# Patient Record
Sex: Female | Born: 2000 | Race: White | Hispanic: No | Marital: Single | Smoking: Never smoker
Health system: Southern US, Community
[De-identification: ages and names within clinical notes are randomized; demographics above are authoritative.]

## PROBLEM LIST (undated history)

## (undated) DIAGNOSIS — E2839 Other primary ovarian failure: Secondary | ICD-10-CM

## (undated) DIAGNOSIS — E079 Disorder of thyroid, unspecified: Secondary | ICD-10-CM

## (undated) DIAGNOSIS — E039 Hypothyroidism, unspecified: Secondary | ICD-10-CM

## (undated) DIAGNOSIS — N179 Acute kidney failure, unspecified: Secondary | ICD-10-CM

## (undated) DIAGNOSIS — E119 Type 2 diabetes mellitus without complications: Secondary | ICD-10-CM

## (undated) HISTORY — PX: NO PAST SURGERIES: SHX2092

---
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
## 2018-02-08 ENCOUNTER — Encounter (HOSPITAL_COMMUNITY): Payer: Self-pay

## 2018-02-08 ENCOUNTER — Other Ambulatory Visit: Payer: Self-pay

## 2018-02-08 ENCOUNTER — Emergency Department (HOSPITAL_COMMUNITY): Payer: Medicaid Other

## 2018-02-08 ENCOUNTER — Inpatient Hospital Stay (HOSPITAL_COMMUNITY)
Admission: EM | Admit: 2018-02-08 | Discharge: 2018-02-10 | DRG: 638 | Disposition: A | Discharge status: Home / Self Care | Payer: Medicaid Other | Attending: Internal Medicine | Admitting: Internal Medicine

## 2018-02-08 DIAGNOSIS — N179 Acute kidney failure, unspecified: Secondary | ICD-10-CM | POA: Diagnosis present

## 2018-02-08 DIAGNOSIS — H3410 Central retinal artery occlusion, unspecified eye: Secondary | ICD-10-CM | POA: Diagnosis present

## 2018-02-08 DIAGNOSIS — A084 Viral intestinal infection, unspecified: Secondary | ICD-10-CM | POA: Diagnosis present

## 2018-02-08 DIAGNOSIS — E2839 Other primary ovarian failure: Secondary | ICD-10-CM | POA: Diagnosis present

## 2018-02-08 DIAGNOSIS — E860 Dehydration: Secondary | ICD-10-CM | POA: Diagnosis present

## 2018-02-08 DIAGNOSIS — Z793 Long term (current) use of hormonal contraceptives: Secondary | ICD-10-CM

## 2018-02-08 DIAGNOSIS — E1010 Type 1 diabetes mellitus with ketoacidosis without coma: Principal | ICD-10-CM | POA: Diagnosis present

## 2018-02-08 DIAGNOSIS — R1110 Vomiting, unspecified: Secondary | ICD-10-CM | POA: Diagnosis present

## 2018-02-08 DIAGNOSIS — Z79899 Other long term (current) drug therapy: Secondary | ICD-10-CM

## 2018-02-08 DIAGNOSIS — E039 Hypothyroidism, unspecified: Secondary | ICD-10-CM | POA: Diagnosis present

## 2018-02-08 HISTORY — DX: Acute kidney failure, unspecified: N17.9

## 2018-02-08 LAB — BASIC METABOLIC PANEL
Anion gap: 20 — ABNORMAL HIGH (ref 5–15)
BUN: 13 mg/dL (ref 6–20)
CHLORIDE: 100 mmol/L — AB (ref 101–111)
CO2: 13 mmol/L — ABNORMAL LOW (ref 22–32)
CREATININE: 1.22 mg/dL — AB (ref 0.44–1.00)
Calcium: 9.9 mg/dL (ref 8.9–10.3)
GFR calc Af Amer: >60 mL/min (ref >60)
GFR calc non Af Amer: >60 mL/min (ref >60)
GLUCOSE: 171 mg/dL — AB (ref 65–99)
POTASSIUM: 4.1 mmol/L (ref 3.5–5.1)
SODIUM: 133 mmol/L — AB (ref 135–145)

## 2018-02-08 LAB — CBC
HEMATOCRIT: 47.9 % — AB (ref 36.0–46.0)
Hemoglobin: 16.9 g/dL — ABNORMAL HIGH (ref 12.0–15.0)
MCH: 32.5 pg (ref 26.0–34.0)
MCHC: 35.3 g/dL (ref 30.0–36.0)
MCV: 92.1 fL (ref 78.0–100.0)
PLATELETS: 497 10*3/uL — AB (ref 150–400)
RBC: 5.2 MIL/uL — ABNORMAL HIGH (ref 3.87–5.11)
RDW: 12.2 % (ref 11.5–15.5)
WBC: 12.9 10*3/uL — ABNORMAL HIGH (ref 4.0–10.5)

## 2018-02-08 LAB — I-STAT TROPONIN, ED: Troponin i, poc: 0 ng/mL (ref 0.00–0.08)

## 2018-02-08 LAB — URINALYSIS, ROUTINE W REFLEX MICROSCOPIC
Bacteria, UA: NONE SEEN
Bilirubin Urine: NEGATIVE
Glucose, UA: >=500 mg/dL — AB
Ketones, ur: 80 mg/dL — AB
Leukocytes, UA: NEGATIVE
Nitrite: NEGATIVE
PROTEIN: 100 mg/dL — AB
SPECIFIC GRAVITY, URINE: 1.023 (ref 1.005–1.030)
pH: 5 (ref 5.0–8.0)

## 2018-02-08 LAB — I-STAT BETA HCG BLOOD, ED (MC, WL, AP ONLY): I-stat hCG, quantitative: <5 m[IU]/mL (ref <5)

## 2018-02-08 NOTE — ED Triage Notes (Signed)
Pt states that she is type one diabetic and has been vomiting since yesterday and her CBG has been high, reports CP with palpations, radiation to back and SOB when taking a deep breath.

## 2018-02-09 ENCOUNTER — Encounter (HOSPITAL_COMMUNITY): Payer: Self-pay | Admitting: Physician Assistant

## 2018-02-09 DIAGNOSIS — E039 Hypothyroidism, unspecified: Secondary | ICD-10-CM

## 2018-02-09 DIAGNOSIS — Z79899 Other long term (current) drug therapy: Secondary | ICD-10-CM | POA: Diagnosis not present

## 2018-02-09 DIAGNOSIS — R197 Diarrhea, unspecified: Secondary | ICD-10-CM

## 2018-02-09 DIAGNOSIS — E860 Dehydration: Secondary | ICD-10-CM

## 2018-02-09 DIAGNOSIS — H3410 Central retinal artery occlusion, unspecified eye: Secondary | ICD-10-CM | POA: Diagnosis present

## 2018-02-09 DIAGNOSIS — N179 Acute kidney failure, unspecified: Secondary | ICD-10-CM | POA: Diagnosis present

## 2018-02-09 DIAGNOSIS — E1010 Type 1 diabetes mellitus with ketoacidosis without coma: Principal | ICD-10-CM

## 2018-02-09 DIAGNOSIS — E2839 Other primary ovarian failure: Secondary | ICD-10-CM | POA: Diagnosis present

## 2018-02-09 DIAGNOSIS — Z793 Long term (current) use of hormonal contraceptives: Secondary | ICD-10-CM | POA: Diagnosis not present

## 2018-02-09 DIAGNOSIS — A084 Viral intestinal infection, unspecified: Secondary | ICD-10-CM | POA: Diagnosis present

## 2018-02-09 DIAGNOSIS — R1110 Vomiting, unspecified: Secondary | ICD-10-CM

## 2018-02-09 LAB — HEPATIC FUNCTION PANEL
ALBUMIN: 4.2 g/dL (ref 3.5–5.0)
ALT: 13 U/L — ABNORMAL LOW (ref 14–54)
AST: 16 U/L (ref 15–41)
Alkaline Phosphatase: 63 U/L (ref 38–126)
Bilirubin, Direct: 0.2 mg/dL (ref 0.1–0.5)
Indirect Bilirubin: 1.8 mg/dL — ABNORMAL HIGH (ref 0.3–0.9)
Total Bilirubin: 2 mg/dL — ABNORMAL HIGH (ref 0.3–1.2)
Total Protein: 7.5 g/dL (ref 6.5–8.1)

## 2018-02-09 LAB — BASIC METABOLIC PANEL
ANION GAP: 6 (ref 5–15)
ANION GAP: 7 (ref 5–15)
ANION GAP: 14 (ref 5–15)
Anion gap: 20 — ABNORMAL HIGH (ref 5–15)
BUN: 12 mg/dL (ref 6–20)
BUN: 6 mg/dL (ref 6–20)
BUN: <5 mg/dL — ABNORMAL LOW (ref 6–20)
BUN: <5 mg/dL — ABNORMAL LOW (ref 6–20)
CALCIUM: 8.8 mg/dL — AB (ref 8.9–10.3)
CALCIUM: 8.8 mg/dL — AB (ref 8.9–10.3)
CHLORIDE: 111 mmol/L (ref 101–111)
CHLORIDE: 112 mmol/L — AB (ref 101–111)
CHLORIDE: 106 mmol/L (ref 101–111)
CO2: 11 mmol/L — ABNORMAL LOW (ref 22–32)
CO2: 19 mmol/L — AB (ref 22–32)
CO2: 19 mmol/L — AB (ref 22–32)
CO2: 15 mmol/L — AB (ref 22–32)
CREATININE: 0.73 mg/dL (ref 0.44–1.00)
Calcium: 7.9 mg/dL — ABNORMAL LOW (ref 8.9–10.3)
Calcium: 8.5 mg/dL — ABNORMAL LOW (ref 8.9–10.3)
Chloride: 104 mmol/L (ref 101–111)
Creatinine, Ser: 1.03 mg/dL — ABNORMAL HIGH (ref 0.44–1.00)
Creatinine, Ser: 0.64 mg/dL (ref 0.44–1.00)
Creatinine, Ser: 0.8 mg/dL (ref 0.44–1.00)
GFR calc Af Amer: >60 mL/min (ref >60)
GFR calc Af Amer: >60 mL/min (ref >60)
GFR calc non Af Amer: >60 mL/min (ref >60)
GFR calc non Af Amer: >60 mL/min (ref >60)
GFR calc non Af Amer: >60 mL/min (ref >60)
GFR calc non Af Amer: >60 mL/min (ref >60)
GLUCOSE: 198 mg/dL — AB (ref 65–99)
GLUCOSE: 59 mg/dL — AB (ref 65–99)
GLUCOSE: 318 mg/dL — AB (ref 65–99)
Glucose, Bld: 184 mg/dL — ABNORMAL HIGH (ref 65–99)
Potassium: 3.5 mmol/L (ref 3.5–5.1)
Potassium: 3.2 mmol/L — ABNORMAL LOW (ref 3.5–5.1)
Potassium: 3.2 mmol/L — ABNORMAL LOW (ref 3.5–5.1)
Potassium: 3.8 mmol/L (ref 3.5–5.1)
SODIUM: 135 mmol/L (ref 135–145)
Sodium: 136 mmol/L (ref 135–145)
Sodium: 138 mmol/L (ref 135–145)
Sodium: 135 mmol/L (ref 135–145)

## 2018-02-09 LAB — CBG MONITORING, ED
GLUCOSE-CAPILLARY: 146 mg/dL — AB (ref 65–99)
GLUCOSE-CAPILLARY: 196 mg/dL — AB (ref 65–99)
GLUCOSE-CAPILLARY: 156 mg/dL — AB (ref 65–99)
GLUCOSE-CAPILLARY: 179 mg/dL — AB (ref 65–99)
Glucose-Capillary: 104 mg/dL — ABNORMAL HIGH (ref 65–99)
Glucose-Capillary: 185 mg/dL — ABNORMAL HIGH (ref 65–99)
Glucose-Capillary: 166 mg/dL — ABNORMAL HIGH (ref 65–99)
Glucose-Capillary: 133 mg/dL — ABNORMAL HIGH (ref 65–99)
Glucose-Capillary: 195 mg/dL — ABNORMAL HIGH (ref 65–99)
Glucose-Capillary: 193 mg/dL — ABNORMAL HIGH (ref 65–99)
Glucose-Capillary: 170 mg/dL — ABNORMAL HIGH (ref 65–99)

## 2018-02-09 LAB — GLUCOSE, CAPILLARY
GLUCOSE-CAPILLARY: 251 mg/dL — AB (ref 65–99)
Glucose-Capillary: 59 mg/dL — ABNORMAL LOW (ref 65–99)
Glucose-Capillary: 78 mg/dL (ref 65–99)
Glucose-Capillary: 76 mg/dL (ref 65–99)
Glucose-Capillary: 383 mg/dL — ABNORMAL HIGH (ref 65–99)

## 2018-02-09 LAB — LIPASE, BLOOD: Lipase: 20 U/L (ref 11–51)

## 2018-02-09 MED ORDER — NORETHINDRONE ACET-ETHINYL EST 1-20 MG-MCG PO TABS: 1.0000 | ORAL_TABLET | Freq: Every day | ORAL | Status: DC

## 2018-02-09 MED ORDER — ONDANSETRON HCL 4 MG/2ML IJ SOLN
4.0000 mg | Freq: Once | INTRAMUSCULAR | Status: AC
  Administered 2018-02-09: 4 mg via INTRAVENOUS
  Filled 2018-02-09: qty 2

## 2018-02-09 MED ORDER — POTASSIUM CHLORIDE 10 MEQ/100ML IV SOLN
10.0000 meq | INTRAVENOUS | Status: AC
  Administered 2018-02-09: 10 meq via INTRAVENOUS
  Filled 2018-02-09: qty 100

## 2018-02-09 MED ORDER — INSULIN GLARGINE 100 UNIT/ML ~~LOC~~ SOLN
21.0000 [IU] | Freq: Every day | SUBCUTANEOUS | Status: DC
  Administered 2018-02-09: 21 [IU] via SUBCUTANEOUS
  Filled 2018-02-09 (×2): qty 0.21

## 2018-02-09 MED ORDER — ENOXAPARIN SODIUM 40 MG/0.4ML ~~LOC~~ SOLN
40.0000 mg | Freq: Every day | SUBCUTANEOUS | Status: DC
  Filled 2018-02-09 (×2): qty 0.4

## 2018-02-09 MED ORDER — SODIUM CHLORIDE 0.9 % IV BOLUS
1000.0000 mL | Freq: Once | INTRAVENOUS | Status: AC
  Administered 2018-02-09: 1000 mL via INTRAVENOUS

## 2018-02-09 MED ORDER — ACETAMINOPHEN 325 MG PO TABS: 650.0000 mg | ORAL_TABLET | Freq: Four times a day (QID) | ORAL | Status: DC | PRN

## 2018-02-09 MED ORDER — SODIUM CHLORIDE 0.9 % IV SOLN: INTRAVENOUS | Status: DC

## 2018-02-09 MED ORDER — SODIUM CHLORIDE 0.9 % IV SOLN
INTRAVENOUS | Status: DC
  Administered 2018-02-09: 500 mL via INTRAVENOUS

## 2018-02-09 MED ORDER — POTASSIUM CHLORIDE CRYS ER 20 MEQ PO TBCR
40.0000 meq | EXTENDED_RELEASE_TABLET | ORAL | Status: AC
  Administered 2018-02-09 (×2): 40 meq via ORAL
  Filled 2018-02-09 (×2): qty 2

## 2018-02-09 MED ORDER — DEXTROSE 10 % IV SOLN
INTRAVENOUS | Status: DC
  Administered 2018-02-09: 06:00:00 via INTRAVENOUS

## 2018-02-09 MED ORDER — POTASSIUM CHLORIDE 10 MEQ/100ML IV SOLN
10.0000 meq | INTRAVENOUS | Status: DC
  Filled 2018-02-09 (×2): qty 100

## 2018-02-09 MED ORDER — DEXTROSE-NACL 5-0.45 % IV SOLN: INTRAVENOUS | Status: DC

## 2018-02-09 MED ORDER — SODIUM CHLORIDE 0.9 % IV SOLN
INTRAVENOUS | Status: DC
  Administered 2018-02-09: 23:00:00 via INTRAVENOUS

## 2018-02-09 MED ORDER — DEXTROSE 10 % IV SOLN
INTRAVENOUS | Status: DC
  Administered 2018-02-09: 1000 mL via INTRAVENOUS
  Administered 2018-02-09: 17:00:00 via INTRAVENOUS

## 2018-02-09 MED ORDER — LEVOTHYROXINE SODIUM 88 MCG PO TABS
88.0000 ug | ORAL_TABLET | Freq: Every day | ORAL | Status: DC
  Administered 2018-02-09: 88 ug via ORAL
  Filled 2018-02-09 (×2): qty 1

## 2018-02-09 MED ORDER — HYDROCODONE-ACETAMINOPHEN 5-325 MG PO TABS: 1.0000 | ORAL_TABLET | ORAL | Status: DC | PRN

## 2018-02-09 MED ORDER — BISACODYL 10 MG RE SUPP: 10.0000 mg | Freq: Every day | RECTAL | Status: DC | PRN

## 2018-02-09 MED ORDER — SODIUM CHLORIDE 0.9 % IV SOLN
INTRAVENOUS | Status: DC
  Administered 2018-02-09: 0.5 [IU]/h via INTRAVENOUS
  Administered 2018-02-09: 1.1 [IU]/h via INTRAVENOUS
  Administered 2018-02-09: 2.7 [IU]/h via INTRAVENOUS

## 2018-02-09 MED ORDER — INSULIN GLARGINE 100 UNITS/ML SOLOSTAR PEN
21.0000 [IU] | PEN_INJECTOR | Freq: Every day | SUBCUTANEOUS | Status: DC
  Filled 2018-02-09: qty 3

## 2018-02-09 MED ORDER — SODIUM CHLORIDE 0.9 % IV SOLN
INTRAVENOUS | Status: DC
  Administered 2018-02-09: 1.3 [IU]/h via INTRAVENOUS
  Filled 2018-02-09: qty 1

## 2018-02-09 MED ORDER — POTASSIUM CHLORIDE 10 MEQ/100ML IV SOLN
10.0000 meq | Freq: Once | INTRAVENOUS | Status: AC
  Administered 2018-02-09: 10 meq via INTRAVENOUS
  Filled 2018-02-09: qty 100

## 2018-02-09 MED ORDER — INSULIN ASPART 100 UNIT/ML ~~LOC~~ SOLN
5.0000 [IU] | Freq: Once | SUBCUTANEOUS | Status: AC
  Administered 2018-02-09: 5 [IU] via SUBCUTANEOUS

## 2018-02-09 MED ORDER — ACETAMINOPHEN 650 MG RE SUPP: 650.0000 mg | Freq: Four times a day (QID) | RECTAL | Status: DC | PRN

## 2018-02-09 MED ORDER — POTASSIUM CHLORIDE 10 MEQ/100ML IV SOLN: 10.0000 meq | INTRAVENOUS | Status: DC

## 2018-02-09 MED ORDER — SENNOSIDES-DOCUSATE SODIUM 8.6-50 MG PO TABS: 1.0000 | ORAL_TABLET | Freq: Every evening | ORAL | Status: DC | PRN

## 2018-02-09 MED ORDER — DEXTROSE 50 % IV SOLN
INTRAVENOUS | Status: AC
  Administered 2018-02-09: 16 mL
  Filled 2018-02-09: qty 50

## 2018-02-09 MED ORDER — ONDANSETRON HCL 4 MG PO TABS: 4.0000 mg | ORAL_TABLET | Freq: Four times a day (QID) | ORAL | Status: DC | PRN

## 2018-02-09 MED ORDER — INSULIN ASPART 100 UNIT/ML ~~LOC~~ SOLN
0.0000 [IU] | Freq: Three times a day (TID) | SUBCUTANEOUS | Status: DC
  Administered 2018-02-10: 2 [IU] via SUBCUTANEOUS

## 2018-02-09 MED ORDER — ONDANSETRON HCL 4 MG/2ML IJ SOLN: 4.0000 mg | Freq: Four times a day (QID) | INTRAMUSCULAR | Status: DC | PRN

## 2018-02-09 MED ORDER — ENOXAPARIN SODIUM 40 MG/0.4ML ~~LOC~~ SOLN
40.0000 mg | Freq: Every day | SUBCUTANEOUS | Status: DC
  Filled 2018-02-09: qty 0.4

## 2018-02-09 NOTE — ED Notes (Signed)
Per MD set insulin at 0.5 and give clear liquid with sugar. Sprite given.

## 2018-02-09 NOTE — Progress Notes (Signed)
Nancy Gallagher is a 17 y.o. female patient admitted from ED awake, alert - oriented  X 4 - no acute distress noted.  VSS - Blood pressure 93/62, pulse 69, temperature 98.4 F (36.9 C), temperature source Oral, resp. rate 18, SpO2 100 %.    IV in place, occlusive dsg intact without redness.  Orientation to room, and floor completed with information packet given to patient/family.  Patient declined safety video at this time.  Admission INP armband ID verified with patient/family, and in place.   SR up x 2, fall assessment complete, with patient and family able to verbalize understanding of risk associated with falls, and verbalized understanding to call nsg before up out of bed.  Call light within reach, patient able to voice, and demonstrate understanding.  Skin, clean-dry- intact without evidence of bruising, or skin tears.   No evidence of skin break down noted on exam.     Will cont to eval and treat per MD orders.  Caren Hazy, RN 02/09/2018 6:23 PM

## 2018-02-09 NOTE — ED Provider Notes (Signed)
[facility] EMERGENCY DEPARTMENT Provider Note   CSN: [id number] Arrival date & time: 02/08/18  2215     History   Chief Complaint Chief Complaint  Patient presents with  . Emesis  . Chest Pain    HPI Nancy Gallagher is a 17 y.o. female.  HPI Patient is a 17 year old female with a history of type 1 diabetes who presents the emergency department for 2 days of nausea vomiting without diarrhea.  No fevers or chills.  She reports some chest pain and some shortness of breath when taking a breath.  She states the pain in her chest is related more to her vomiting.  She has a history of DKA reports her breathing felt this way with DKA before in the past.  They checked ketone strips at home and they were extremely positive.  Her highest blood sugar was 350 has home.    Past Medical History:  Diagnosis Date  . Diabetes mellitus without complication (HCC)   . Hypothyroid   . Ovarian failure    bilateral  . Thyroid disease     Patient Active Problem List   Diagnosis Date Noted  . Diabetic ketoacidosis without coma associated with type 1 diabetes mellitus (HCC)   . Dehydration, moderate 08/24/2014  . Emesis 08/24/2014  . Metabolic acidosis, increased anion gap 08/24/2014  . DKA (diabetic ketoacidoses) (HCC) 08/24/2014    History reviewed. No pertinent surgical history.   OB History   None      Home Medications    Prior to Admission medications   Medication Sig Start Date End Date Taking? Authorizing Provider  HYDROcodone-acetaminophen (NORCO) 5-325 MG tablet Take 1 tablet by mouth every 6 (six) hours as needed for severe pain. 10/08/17  Yes Loren Racer, MD  insulin aspart (NOVOLOG FLEXPEN) 100 UNIT/ML FlexPen Inject 2-10 Units into the skin 3 (three) times daily with meals. Inject 1 unit subcutaneously for every 7 grams of carbs consumed   Yes [provider]  insulin glargine (LANTUS) 100 unit/mL SOPN Inject 0.25 mLs (25 Units total) into the  skin daily at 10 pm. Patient taking differently: Inject 21 Units into the skin at bedtime.  08/26/14  Yes Pincus Large, DO  levothyroxine (SYNTHROID, LEVOTHROID) 88 MCG tablet Take 88 mcg by mouth at bedtime.    Yes [provider]  norethindrone-ethinyl estradiol (JUNEL 1/20) 1-20 MG-MCG tablet Take 1 tablet by mouth at bedtime.   Yes [provider]  ondansetron (ZOFRAN ODT) 4 MG disintegrating tablet Take 1 tablet (4 mg total) by mouth every 8 (eight) hours as needed for nausea or vomiting. 08/21/16  Yes Ree Shay, MD    Family History No family history on file.  Social History Social History   Tobacco Use  . Smoking status: Never Smoker  . Smokeless tobacco: Never Used  Substance Use Topics  . Alcohol use: Yes    Comment: Occasionally   . Drug use: No     Allergies   Patient has no known allergies.   Review of Systems Review of Systems  All other systems reviewed and are negative.    Physical Exam Updated Vital Signs BP 116/85   Pulse 81   Temp 99 F (37.2 C) (Oral)   Resp 14   SpO2 100%   Physical Exam  Constitutional: She is oriented to person, place, and time. She appears well-developed and well-nourished. No distress.  HENT:  Head: Normocephalic and atraumatic.  Eyes: EOM are normal.  Neck:  Normal range of motion.  Cardiovascular: Normal rate, regular rhythm and normal heart sounds.  Pulmonary/Chest: Effort normal and breath sounds normal.  Abdominal: Soft. She exhibits no distension. There is no tenderness.  Musculoskeletal: Normal range of motion.  Neurological: She is alert and oriented to person, place, and time.  Skin: Skin is warm and dry.  Psychiatric: She has a normal mood and affect. Judgment normal.  Nursing note and vitals reviewed.    ED Treatments / Results  Labs (all labs ordered are listed, but only abnormal results are displayed) Labs Reviewed  BASIC METABOLIC PANEL - Abnormal; Notable for the following  components:      Result Value   Sodium 133 (*)    Chloride 100 (*)    CO2 13 (*)    Glucose, Bld 171 (*)    Creatinine, Ser 1.22 (*)    Anion gap 20 (*)    All other components within normal limits  CBC - Abnormal; Notable for the following components:   WBC 12.9 (*)    RBC 5.20 (*)    Hemoglobin 16.9 (*)    HCT 47.9 (*)    Platelets 497 (*)    All other components within normal limits  URINALYSIS, ROUTINE W REFLEX MICROSCOPIC - Abnormal; Notable for the following components:   Glucose, UA >=500 (*)    Hgb urine dipstick SMALL (*)    Ketones, ur 80 (*)    Protein, ur 100 (*)    All other components within normal limits  CBG MONITORING, ED - Abnormal; Notable for the following components:   Glucose-Capillary 104 (*)    All other components within normal limits  CBG MONITORING, ED - Abnormal; Notable for the following components:   Glucose-Capillary 146 (*)    All other components within normal limits  HEPATIC FUNCTION PANEL  LIPASE, BLOOD  I-STAT TROPONIN, ED  I-STAT BETA HCG BLOOD, ED (MC, [facility], AP ONLY)    EKG EKG Interpretation  Date/Time:  Thursday Feb 08 2018 23:01:22 EDT Ventricular Rate:  116 PR Interval:  114 QRS Duration: 68 QT Interval:  282 QTC Calculation: 391 R Axis:   88 Text Interpretation:  Sinus tachycardia Otherwise normal ECG No old tracing to compare Confirmed by Dione Booze ([id number]) on 02/09/2018 1:43:40 AM   Radiology Dg Chest 2 View  Result Date: 02/08/2018 CLINICAL DATA:  Vomiting since yesterday. Chest pain with palpitations. EXAM: CHEST - 2 VIEW COMPARISON:  08/21/2016 FINDINGS: The heart size and mediastinal contours are within normal limits. Both lungs are clear. The visualized skeletal structures are unremarkable. IMPRESSION: No active cardiopulmonary disease. Electronically Signed   By: Tollie Eth M.D.   On: 02/08/2018 23:26    Procedures .Critical Care Performed by: Azalia Bilis, MD Authorized by: Azalia Bilis, MD    CRITICAL  CARE Performed by: Azalia Bilis Total critical care time: 31 minutes Critical care time was exclusive of separately billable procedures and treating other patients. Critical care was necessary to treat or prevent imminent or life-threatening deterioration. Critical care was time spent personally by me on the following activities: development of treatment plan with patient and/or surrogate as well as nursing, discussions with consultants, evaluation of patient's response to treatment, examination of patient, obtaining history from patient or surrogate, ordering and performing treatments and interventions, ordering and review of laboratory studies, ordering and review of radiographic studies, pulse oximetry and re-evaluation of patient's condition.   Medications Ordered in ED Medications  insulin regular (NOVOLIN R,HUMULIN R) 100 Units in  sodium chloride 0.9 % 100 mL (1 Units/mL) infusion (has no administration in time range)  sodium chloride 0.9 % bolus 1,000 mL (has no administration in time range)    And  sodium chloride 0.9 % bolus 1,000 mL (has no administration in time range)    And  0.9 %  sodium chloride infusion (has no administration in time range)  dextrose 10 % infusion (has no administration in time range)  ondansetron (ZOFRAN) injection 4 mg (has no administration in time range)     Initial Impression / Assessment and Plan / ED Course  I have reviewed the triage vital signs and the nursing notes.  Pertinent labs & imaging results that were available during my care of the patient were reviewed by me and considered in my medical decision making (see chart for details).     Patient presenting with nausea vomiting hyperglycemia.  Anion gap is 20.  Bicarb is 13.  Patient with evidence of diabetic ketoacidosis.  LFTs and lipase will be added.  No significant tenderness in her abdomen at this time.  Urine shows no signs of infection.  Patient will be started on insulin drip at this  time.  IV fluid bolus for fluid resuscitation.  Tachycardic on arrival to the ER.  Likely orthostatic.  Chest x-ray without abnormality.  Patient will be monitored closely in the emergency department.  She is stable for admission to the stepdown unit at this time.  Patient and family updated.  Final Clinical Impressions(s) / ED Diagnoses   Final diagnoses:  Type 1 diabetes mellitus with ketoacidosis without coma [facility])    ED Discharge Orders    None       Azalia Bilis, MD 02/09/18 [phone]

## 2018-02-09 NOTE — ED Notes (Signed)
Labs and imagining reviewed

## 2018-02-09 NOTE — ED Notes (Signed)
Patient transferred to hospital bed.  

## 2018-02-09 NOTE — H&P (Addendum)
History and Physical    BERDENE ASKARI ZOX:[id number] DOB: [birth year] DOA: 02/08/2018   PCP: Irena Reichmann, DO   Patient coming from:  Home    Chief Complaint: Nausea and vomiting  HPI: DAZHA KEMPA is a 17 y.o. female with medical history significant for hypothyroidism, ANA positive, diabetes type 1 formally diagnosed in 2005,  on Lantus and Novologwith goal A1c less than 7.5%.,  History of DKA at age 41, 43, 67 and now, presenting with 1 day history of nausea and vomiting, without diarrhea. These symptoms followed a recent viral gastroenteritis 1 week ago. She denies any fever or chills. She reported some chest pain and shortness of breath but she reports that, compared to her prior presentations with DKA these symptoms are similar.  She denies any muscle cramps, weight gain, mood swings, or jaundice.  She denies gastroparesis, orthostasis, or paresthesias.  She denies any confusion, or vision changes.  Highest blood sugar at home was 350, with ketone strips very positive.  She denies any tobacco, alcohol, or recreational drug use.  She denies any long distance trips.  She is compliant with her insulin.   ED Course:  BP 109/79   Pulse (!) 101   Temp 99 F (37.2 C) (Oral)   Resp 19   SpO2 100%   Sodium was 133, glucose 166 at this time, was 348 on admission.  Chloride 100 at this time, creatinine 1.22, was 0.7 initially.  Troponin 0.  White count 12.9, was 14.201 presentation.  Hemoglobin 16.9, platelets 497. Urine with negative leukocytes negative nitrites, significant protein, and ketones. Pregnancy test is negative Chest x-ray is negative Initial EKG shows sinus tachycardia, but otherwise normal. DKA protocol was initiated. She also received generous IV fluids, and Zofran for nausea, the patient is asymptomatic at this time.  Her anion gap is now at 20.   Review of Systems:  As per HPI otherwise all other systems reviewed and are negative  Past Medical History:  Diagnosis Date   . AKI (acute kidney injury) (HCC)   . Diabetes mellitus without complication (HCC)   . Hypothyroid   . Ovarian failure    bilateral  . Thyroid disease     Past Surgical History:  Procedure Laterality Date  . NO PAST SURGERIES      Social History Social History   Socioeconomic History  . Marital status: Single    Spouse name: Not on file  . Number of children: Not on file  . Years of education: Not on file  . Highest education level: Not on file  Occupational History  . Not on file  Social Needs  . Financial resource strain: Not on file  . Food insecurity:    Worry: Not on file    Inability: Not on file  . Transportation needs:    Medical: Not on file    Non-medical: Not on file  Tobacco Use  . Smoking status: Never Smoker  . Smokeless tobacco: Never Used  Substance and Sexual Activity  . Alcohol use: Yes    Comment: Occasionally   . Drug use: No  . Sexual activity: Never  Lifestyle  . Physical activity:    Days per week: Not on file    Minutes per session: Not on file  . Stress: Not on file  Relationships  . Social connections:    Talks on phone: Not on file    Gets together: Not on file    Attends religious service: Not on file  Active member of club or organization: Not on file    Attends meetings of clubs or organizations: Not on file    Relationship status: Not on file  . Intimate partner violence:    Fear of current or ex partner: Not on file    Emotionally abused: Not on file    Physically abused: Not on file    Forced sexual activity: Not on file  Other Topics Concern  . Not on file  Social History Narrative   Lives with Mom. Type 1 diabetic followed at [facility]     No Known Allergies  History reviewed. No pertinent family history.    Prior to Admission medications   Medication Sig Start Date End Date Taking? Authorizing Provider  HYDROcodone-acetaminophen (NORCO) 5-325 MG tablet Take 1 tablet by mouth every 6 (six) hours as needed  for severe pain. 10/08/17  Yes Loren Racer, MD  insulin aspart (NOVOLOG FLEXPEN) 100 UNIT/ML FlexPen Inject 2-10 Units into the skin 3 (three) times daily with meals. Inject 1 unit subcutaneously for every 7 grams of carbs consumed   Yes [provider]  insulin glargine (LANTUS) 100 unit/mL SOPN Inject 0.25 mLs (25 Units total) into the skin daily at 10 pm. Patient taking differently: Inject 21 Units into the skin at bedtime.  08/26/14  Yes Pincus Large, DO  levothyroxine (SYNTHROID, LEVOTHROID) 88 MCG tablet Take 88 mcg by mouth at bedtime.    Yes [provider]  norethindrone-ethinyl estradiol (JUNEL 1/20) 1-20 MG-MCG tablet Take 1 tablet by mouth at bedtime.   Yes [provider]  ondansetron (ZOFRAN ODT) 4 MG disintegrating tablet Take 1 tablet (4 mg total) by mouth every 8 (eight) hours as needed for nausea or vomiting. 08/21/16  Yes Ree Shay, MD    Physical Exam:  Vitals:   02/08/18 2253 02/09/18 0104 02/09/18 0515 02/09/18 0645  BP: (!) 135/103 118/75 116/85 109/79  Pulse: (!) 136 (!) 113 81 (!) 101  Resp: Temp: 99 F (37.2 C)     TempSrc: Oral     SpO2: 100% 100% 100% 100%   Constitutional: NAD, calm, comfortable Eyes: PERRL, lids and conjunctivae normal ENMT: Mucous membranes are moist, without exudate or lesions  Neck: normal, supple, no masses, no thyromegaly Respiratory: clear to auscultation bilaterally, no wheezing, no crackles. Normal respiratory effort  Cardiovascular: Regular rate and rhythm,  murmur, rubs or gallops. No extremity edema. 2+ pedal pulses. No carotid bruits.  Abdomen: Soft, non tender, No hepatosplenomegaly. Bowel sounds positive.  Musculoskeletal: no clubbing / cyanosis. Moves all extremities Skin: no jaundice, No lesions.  Neurologic: Sensation intact  Strength equal in all extremities Psychiatric:   Alert and oriented x 3. Normal mood.     Labs on Admission: I have personally reviewed following  labs and imaging studies  CBC: Recent Labs  Lab 02/08/18 2302  WBC 12.9*  HGB 16.9*  HCT 47.9*  MCV 92.1  PLT 497*    Basic Metabolic Panel: Recent Labs  Lab 02/08/18 2302  NA 133*  K 4.1  CL 100*  CO2 13*  GLUCOSE 171*  BUN 13  CREATININE 1.22*  CALCIUM 9.9    GFR: CrCl cannot be calculated (Unknown ideal weight.).  Liver Function Tests: No results for input(s): AST, ALT, ALKPHOS, BILITOT, PROT, ALBUMIN in the last 168 hours. No results for input(s): LIPASE, AMYLASE in the last 168 hours. No results for input(s): AMMONIA in the last 168 hours.  Coagulation Profile:  No results for input(s): INR, PROTIME in the last 168 hours.  Cardiac Enzymes: No results for input(s): CKTOTAL, CKMB, CKMBINDEX, TROPONINI in the last 168 hours.  BNP (last 3 results) No results for input(s): PROBNP in the last 8760 hours.  HbA1C: No results for input(s): HGBA1C in the last 72 hours.  CBG: Recent Labs  Lab 02/09/18 0227 02/09/18 0506 02/09/18 0539 02/09/18 0655  GLUCAP 104* 146* 185* 166*    Lipid Profile: No results for input(s): CHOL, HDL, LDLCALC, TRIG, CHOLHDL, LDLDIRECT in the last 72 hours.  Thyroid Function Tests: No results for input(s): TSH, T4TOTAL, FREET4, T3FREE, THYROIDAB in the last 72 hours.  Anemia Panel: No results for input(s): VITAMINB12, FOLATE, FERRITIN, TIBC, IRON, RETICCTPCT in the last 72 hours.  Urine analysis:    Component Value Date/Time   COLORURINE YELLOW 02/08/2018 2308   APPEARANCEUR CLEAR 02/08/2018 2308   LABSPEC 1.023 02/08/2018 2308   PHURINE 5.0 02/08/2018 2308   GLUCOSEU >=500 (A) 02/08/2018 2308   HGBUR SMALL (A) 02/08/2018 2308   BILIRUBINUR NEGATIVE 02/08/2018 2308   KETONESUR 80 (A) 02/08/2018 2308   PROTEINUR 100 (A) 02/08/2018 2308   UROBILINOGEN 0.2 08/24/2014 0110   NITRITE NEGATIVE 02/08/2018 2308   LEUKOCYTESUR NEGATIVE 02/08/2018 2308    Sepsis Labs: (procalcitonin:4,lacticidven:4) )No results  found for this or any previous visit (from the past 240 hour(s)).   Radiological Exams on Admission: Dg Chest 2 View  Result Date: 02/08/2018 CLINICAL DATA:  Vomiting since yesterday. Chest pain with palpitations. EXAM: CHEST - 2 VIEW COMPARISON:  08/21/2016 FINDINGS: The heart size and mediastinal contours are within normal limits. Both lungs are clear. The visualized skeletal structures are unremarkable. IMPRESSION: No active cardiopulmonary disease. Electronically Signed   By: Tollie Eth M.D.   On: 02/08/2018 23:26    EKG: Independently reviewed. EKG shows sinus tachycardia, but otherwise normal Assessment/Plan     Diabetic Ketoacidosis likely precipitated by a recent viral gastroenteritis.  She has a history of diabetes type 1 admission Glucose was 348, with anion gap of 20, bicarb 13, insulin drip was initiated, along with D5 and normal saline boluses.  Current glucose is 171. Admit for SDU DKA order set  Can advance to clear liquids  Potassium replenishment as per protocol Bmet every 4 hours Continue IVF   Nausea and vomiting secondary to above. No stools or emesis since admission.  Denies abdominal pain afebrile.  White count is 12.9, improved from 14.2.Marland KitchenAfebrile asymptomatic at this time  IVF with K replenishment Continue IV antiemetics. start clear liquids  Hypothyroidism: Continue home Synthroid   Acute Kidney Injury likely due to dehydration due to volume loss central retinal artery occlusion 1.22 Lab Results  Component Value Date   CREATININE 1.22 (H) 02/08/2018   CREATININE 0.60 08/21/2016   CREATININE 0.70 08/21/2016  IVF  Repeat BMET in am, expected to improve    DVT prophylaxis: Lovenox Code Status:    FUll  Family Communication:  Discussed with patient and mother  Disposition Plan: Expect patient to be discharged to home after condition improves Consults called:    None  Admission status: IP SDU    Marlowe Kays, PA-C [facility]   Amion text   [id number]   02/09/2018, 7:36 AM  I have examined the patient, discussed with patient, mother and Marlowe Kays, [place]. Agree with plan above which I have formulated with Marlowe Kays, PA.  This is a 17 y/o with DM1 who began to vomit on Sunday and continued through  Thursday. She did not vomit much yesterday but her mother noted that she did not look well, appeared dehydrated. She bought some Ketone strips and found her to have ketones in her urine. In the ED, CBG was actually not significantly elevated in the ED (171) but she has anion gap acidosis and dehydration.   At this point, she is not vomiting (> 24 hrs now), she is on D10 to maintain her glucose from dropping while being on an insulin infusion. Can be transitioned to NS when glucose > 250. She has received 2 L of NS already.  Will start clears and continue infusion until anion gap acidosis resolves. Suspecting gastroenteritis as the cause of her DKA.   Calvert Cantor, MD Pager: [email or web address]

## 2018-02-09 NOTE — Progress Notes (Addendum)
Inpatient Diabetes Program Recommendations  AACE/ADA: New Consensus Statement on Inpatient Glycemic Control (2015)  Target Ranges:  Prepandial:   less than 140 mg/dL      Peak postprandial:   less than 180 mg/dL (1-2 hours)      Critically ill patients:  140 - 180 mg/dL   Results for LAVILLA, DELAMORA (MRN [id number]) as of 02/09/2018 12:39  Ref. Range 02/08/2018 23:02  Sodium Latest Ref Range: 135 - 145 mmol/L 133 (L)  Potassium Latest Ref Range: 3.5 - 5.1 mmol/L 4.1  Chloride Latest Ref Range: 101 - 111 mmol/L 100 (L)  CO2 Latest Ref Range: 22 - 32 mmol/L 13 (L)  Glucose Latest Ref Range: 65 - 99 mg/dL 171 (H)  BUN Latest Ref Range: 6 - 20 mg/dL 13  Creatinine Latest Ref Range: 0.44 - 1.00 mg/dL 1.22 (H)  Calcium Latest Ref Range: 8.9 - 10.3 mg/dL 9.9  Anion gap Latest Ref Range: 5 - 15  20 (H)    Admit DKA likely precipitated by a recent viral gastroenteritis  History: Type 1 DM  Home DM Meds: Lantus 21 units QHS        Novolog 2-10 units TID (1 unit for every 7 grams Carbs and 1 unit for every 40 mg/dl > 150 mg/dl)  Current Orders: IV Insulin drip started at 5:46am today.        Next BMET pending.  MD- Once pt's CO2 has reached 20 and Anion Gap <12, please make sure to transition pt to SQ insulin with the following:  Lantus 21 units daily (make sure to give Lantus at least 1 hour prior to d/c of IV Insulin drip)  Novolog Sensitive Correction Scale/ SSI (0-9 units) TID AC + HS  Novolog 4 units TID with meals (hold if pt eats <50% of meal)      D10% IVF running at 75cc/hour.  CBGs stable.  Endocrinologist: Elisabeth Pigeon, NP with [facility] [place] seen by ENDO on 11/28/2016.  Met with patient's Mom down in the ED (patient rolling around in bed moaning b/c of pain she is having in arm as IV Potassium is infusing).  Mom stated pt no longer sees Elisabeth Pigeon b/c pt aged out of the program at [facility].  Currently seeking care under  another Endocrinology practice.  Patient getting Insulins Rxs refilled by primary MD (Dr. Ranae Palms with [facility] care in [place]).  Verified home insulin regimen with Mom (see above).  Has access to meds.  No other issues at this time.  Waiting for transfer to nursing unit from the ED.     --Will follow patient during hospitalization--  Wyn Quaker RN, MSN, CDE Diabetes Coordinator Inpatient Glycemic Control Team Team Pager: [phone] (8a-5p)

## 2018-02-09 NOTE — ED Notes (Signed)
Ordered diet tray as well as hospital bed for pt

## 2018-02-10 LAB — CBC
HEMATOCRIT: 34.9 % — AB (ref 36.0–46.0)
Hemoglobin: 12.1 g/dL (ref 12.0–15.0)
MCH: 31.5 pg (ref 26.0–34.0)
MCHC: 34.7 g/dL (ref 30.0–36.0)
MCV: 90.9 fL (ref 78.0–100.0)
PLATELETS: 281 10*3/uL (ref 150–400)
RBC: 3.84 MIL/uL — AB (ref 3.87–5.11)
RDW: 12.3 % (ref 11.5–15.5)
WBC: 9.1 10*3/uL (ref 4.0–10.5)

## 2018-02-10 LAB — BASIC METABOLIC PANEL
ANION GAP: 11 (ref 5–15)
BUN: <5 mg/dL — ABNORMAL LOW (ref 6–20)
CALCIUM: 8.9 mg/dL (ref 8.9–10.3)
CHLORIDE: 108 mmol/L (ref 101–111)
CO2: 17 mmol/L — ABNORMAL LOW (ref 22–32)
Creatinine, Ser: 0.82 mg/dL (ref 0.44–1.00)
GFR calc non Af Amer: >60 mL/min (ref >60)
GLUCOSE: 253 mg/dL — AB (ref 65–99)
POTASSIUM: 3.5 mmol/L (ref 3.5–5.1)
Sodium: 136 mmol/L (ref 135–145)

## 2018-02-10 LAB — GLUCOSE, CAPILLARY
Glucose-Capillary: 152 mg/dL — ABNORMAL HIGH (ref 65–99)
Glucose-Capillary: 193 mg/dL — ABNORMAL HIGH (ref 65–99)
Glucose-Capillary: 114 mg/dL — ABNORMAL HIGH (ref 65–99)

## 2018-02-10 MED ORDER — INSULIN GLARGINE 100 UNITS/ML SOLOSTAR PEN: 21.0000 [IU] | PEN_INJECTOR | Freq: Every day | SUBCUTANEOUS | Status: AC

## 2018-02-10 NOTE — Progress Notes (Addendum)
Patient discharge instruction given. Patient verbalized understanding. Mother at the bedside and significant other present during discharge. IV's removed. Skin intact. No new prescriptions added. 

## 2018-02-10 NOTE — Progress Notes (Signed)
Patient discharge instruction given. Patient verbalized understanding. Mother at the bedside and significant other present during discharge. IV's removed. Skin intact. No new prescriptions added.

## 2018-02-10 NOTE — Discharge Summary (Signed)
[facility] Discharge Summary   Patient: Nancy Gallagher WJX:[id number]   PCP: Irena Reichmann, DO DOB: [birth year]   Date of admission: 02/08/2018   Date of discharge:  02/10/2018    Discharge Diagnoses:  Active Problems:   Dehydration, moderate   Emesis   Diabetic ketoacidosis without coma associated with type 1 diabetes mellitus (HCC)   Hypothyroidism   Admitted From: home Disposition:  home  Recommendations for Outpatient Follow-up:  1. Is follow-up with PCP in 1 week.  Follow-up Information    Irena Reichmann, DO.   Specialty:  Family Medicine Contact information: [address] [place] [address] [phone]        Irena Reichmann, DO .   Specialty:  Family Medicine Contact information: [address] [place] [address] [phone]          Diet recommendation: Carb modified diet  Activity: The patient is advised to gradually reintroduce usual activities.  Discharge Condition: good  Code Status: Full code  History of present illness: As per the H and P dictated on admission, " Nancy Gallagher is a 17 y.o. female with medical history significant for hypothyroidism, ANA positive, diabetes type 1 formally diagnosed in 2005,  on Lantus and Novologwith goal A1c less than 7.5%.,  History of DKA at age 44, 25, 45 and now, presenting with 1 day history of nausea and vomiting, without diarrhea. These symptoms followed a recent viral gastroenteritis 1 week ago. She denies any fever or chills. She reported some chest pain and shortness of breath but she reports that, compared to her prior presentations with DKA these symptoms are similar.  She denies any muscle cramps, weight gain, mood swings, or jaundice.  She denies gastroparesis, orthostasis, or paresthesias.  She denies any confusion, or vision changes.  Highest blood sugar at home was 350, with ketone strips very positive.  She denies any tobacco, alcohol, or recreational drug use.  She denies any long  distance trips.  She is compliant with her insulin."  Hospital Course:  Summary of her active problems in the hospital is as following. Diabetic Ketoacidosis likely precipitated by a recent viral gastroenteritis.   On admission glucose was 348, with anion gap of 20, bicarb 13,  insulin drip was initiated, along with D5 and normal saline boluses.  Anion gap closed and remains closed for multiple BMP after that. Patient transition to home insulin regimen and tolerating it very well. Oral intake improved. No change of insulin regimen discharge.  Nausea and vomiting secondary to above.  Feeling better, nausea vomiting resolved.  Oral diet improved.  Hypothyroidism: Continue home Synthroid  Acute Kidney Injury likely due to dehydration due to volume loss Now better.    All other chronic medical condition were stable during the hospitalization.  Patient was ambulatory without any assistance. On the day of the discharge the patient's vitals were stable , and no other acute medical condition were reported by patient. the patient was felt safe to be discharge at home with family.  Consultants: none Procedures: none  DISCHARGE MEDICATION: Allergies as of 02/10/2018   No Known Allergies     Medication List    TAKE these medications   HYDROcodone-acetaminophen 5-325 MG tablet Commonly known as:  NORCO Take 1 tablet by mouth every 6 (six) hours as needed for severe pain.   insulin glargine 100 unit/mL Sopn Commonly known as:  LANTUS Inject 0.21 mLs (21 Units total) into the skin daily at 10 pm.  JUNEL 1/20 1-20 MG-MCG tablet Generic drug:  norethindrone-ethinyl estradiol Take 1 tablet by mouth at bedtime.   levothyroxine 88 MCG tablet Commonly known as:  SYNTHROID, LEVOTHROID Take 88 mcg by mouth at bedtime.   NOVOLOG FLEXPEN 100 UNIT/ML FlexPen Generic drug:  insulin aspart Inject 2-10 Units into the skin 3 (three) times daily with meals. Inject 1 unit subcutaneously for  every 7 grams of carbs consumed   ondansetron 4 MG disintegrating tablet Commonly known as:  ZOFRAN ODT Take 1 tablet (4 mg total) by mouth every 8 (eight) hours as needed for nausea or vomiting.      No Known Allergies Discharge Instructions    Diet - low sodium heart healthy   Complete by:  As directed    Discharge instructions   Complete by:  As directed    It is important that you read following instructions as well as go over your medication list with RN to help you understand your care after this hospitalization.  Discharge Instructions: Please follow-up with PCP in one week  Please request your primary care physician to go over all Hospital Tests and Procedure/Radiological results at the follow up,  Please get all Hospital records sent to your PCP by signing hospital release before you go home.   Do not take more than prescribed Pain, Sleep and Anxiety Medications. You were cared for by a hospitalist during your hospital stay. If you have any questions about your discharge medications or the care you received while you were in the hospital after you are discharged, you can call the unit and ask to speak with the hospitalist on call if the hospitalist that took care of you is not available.  Once you are discharged, your primary care physician will handle any further medical issues. Please note that NO REFILLS for any discharge medications will be authorized once you are discharged, as it is imperative that you return to your primary care physician (or establish a relationship with a primary care physician if you do not have one) for your aftercare needs so that they can reassess your need for medications and monitor your lab values. You Must read complete instructions/literature along with all the possible adverse reactions/side effects for all the Medicines you take and that have been prescribed to you. Take any new Medicines after you have completely understood and accept all the  possible adverse reactions/side effects. Wear Seat belts while driving. If you have smoked or chewed Tobacco in the last 2 yrs please stop smoking and/or stop any Recreational drug use.   Increase activity slowly   Complete by:  As directed      Discharge Exam: There were no vitals filed for this visit. Vitals:   02/10/18 0536 02/10/18 1550  BP: 101/65 102/62  Pulse: 67 63  Resp: (!) 22 16  Temp: 98.3 F (36.8 C) 97.9 F (36.6 C)  SpO2: 100% 100%   General: Appear in no distress, no Rash; Oral Mucosa moist. Cardiovascular: S1 and S2 Present, no Murmur, no JVD Respiratory: Bilateral Air entry present and Clear to Auscultation, no Crackles, no wheezes Abdomen: Bowel Sound present, Soft and no tenderness Extremities: no Pedal edema, no calf tenderness Neurology: Grossly no focal neuro deficit.  The results of significant diagnostics from this hospitalization (including imaging, microbiology, ancillary and laboratory) are listed below for reference.    Significant Diagnostic Studies: Dg Chest 2 View  Result Date: 02/08/2018 CLINICAL DATA:  Vomiting since yesterday. Chest pain with palpitations. EXAM: CHEST -  2 VIEW COMPARISON:  08/21/2016 FINDINGS: The heart size and mediastinal contours are within normal limits. Both lungs are clear. The visualized skeletal structures are unremarkable. IMPRESSION: No active cardiopulmonary disease. Electronically Signed   By: Tollie Eth M.D.   On: 02/08/2018 23:26    Microbiology: No results found for this or any previous visit (from the past 240 hour(s)).   Labs: CBC: Recent Labs  Lab 02/08/18 2302 02/10/18 0417  WBC 12.9* 9.1  HGB 16.9* 12.1  HCT 47.9* 34.9*  MCV 92.1 90.9  PLT 497* 281   Basic Metabolic Panel: Recent Labs  Lab 02/09/18 0623 02/09/18 1321 02/09/18 1658 02/09/18 2053 02/10/18 0008  NA 135 136 138 135 136  K 3.5 3.2* 3.2* 3.8 3.5  CL 104 111 112* 106 108  CO2 11* 19* 19* 15* 17*  GLUCOSE 184* 198* 59* 318*  253*  BUN 12 6 <5* <5* <5*  CREATININE 1.03* 0.73 0.64 0.80 0.82  CALCIUM 8.8* 7.9* 8.5* 8.8* 8.9   Liver Function Tests: Recent Labs  Lab 02/09/18 0547  AST 16  ALT 13*  ALKPHOS 63  BILITOT 2.0*  PROT 7.5  ALBUMIN 4.2   Recent Labs  Lab 02/09/18 0547  LIPASE 20   No results for input(s): AMMONIA in the last 168 hours. Cardiac Enzymes: No results for input(s): CKTOTAL, CKMB, CKMBINDEX, TROPONINI in the last 168 hours. BNP (last 3 results) No results for input(s): BNP in the last 8760 hours. CBG: Recent Labs  Lab 02/09/18 1824 02/09/18 1942 02/09/18 2148 02/10/18 0134 02/10/18 0900  GLUCAP 76 251* 383* 152* 193*   Time spent: 35 minutes  Signed:  Lynden Oxford  [facility]  02/10/2018  , 4:23 PM

## 2019-08-25 IMAGING — DX DG HAND COMPLETE 3+V*L*
3 series · 3 of 3 positions shown · non-contrast
Comparison: 04/28/2013

CLINICAL DATA: Acute left hand pain following injury last night.
Initial encounter.

EXAM:
LEFT HAND - COMPLETE 3+ VIEW

[hand pa]
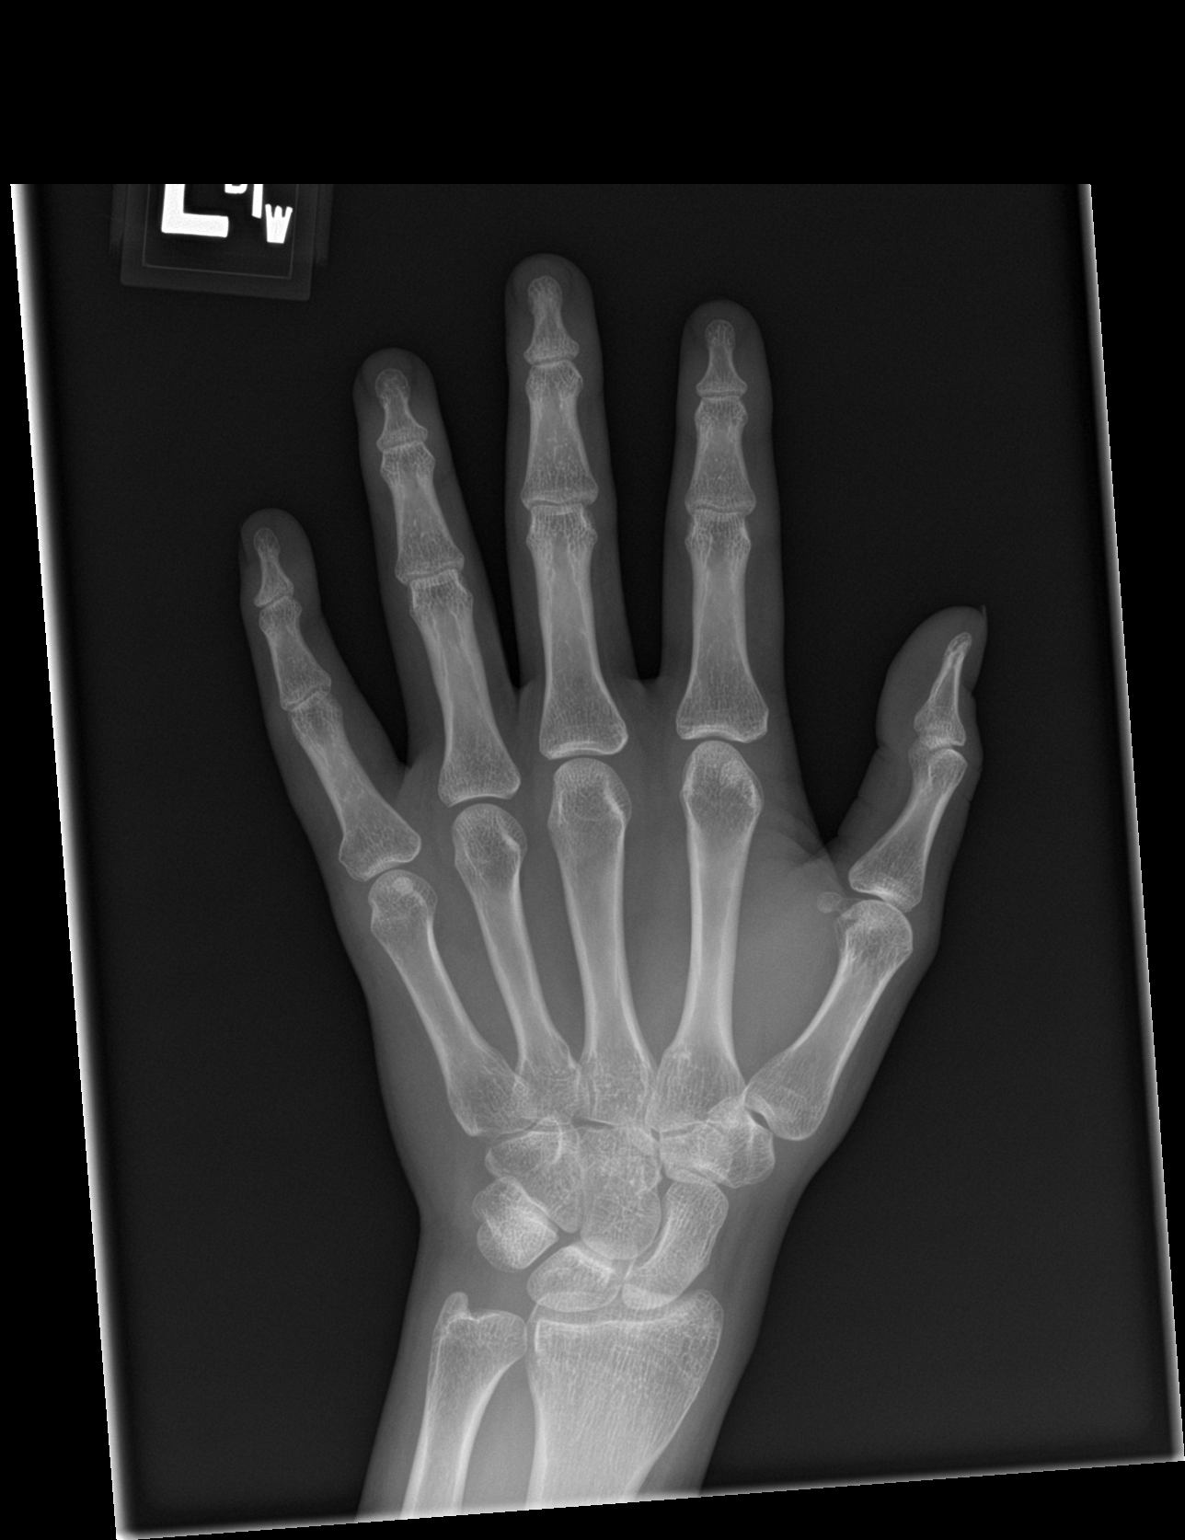

[hand obl]
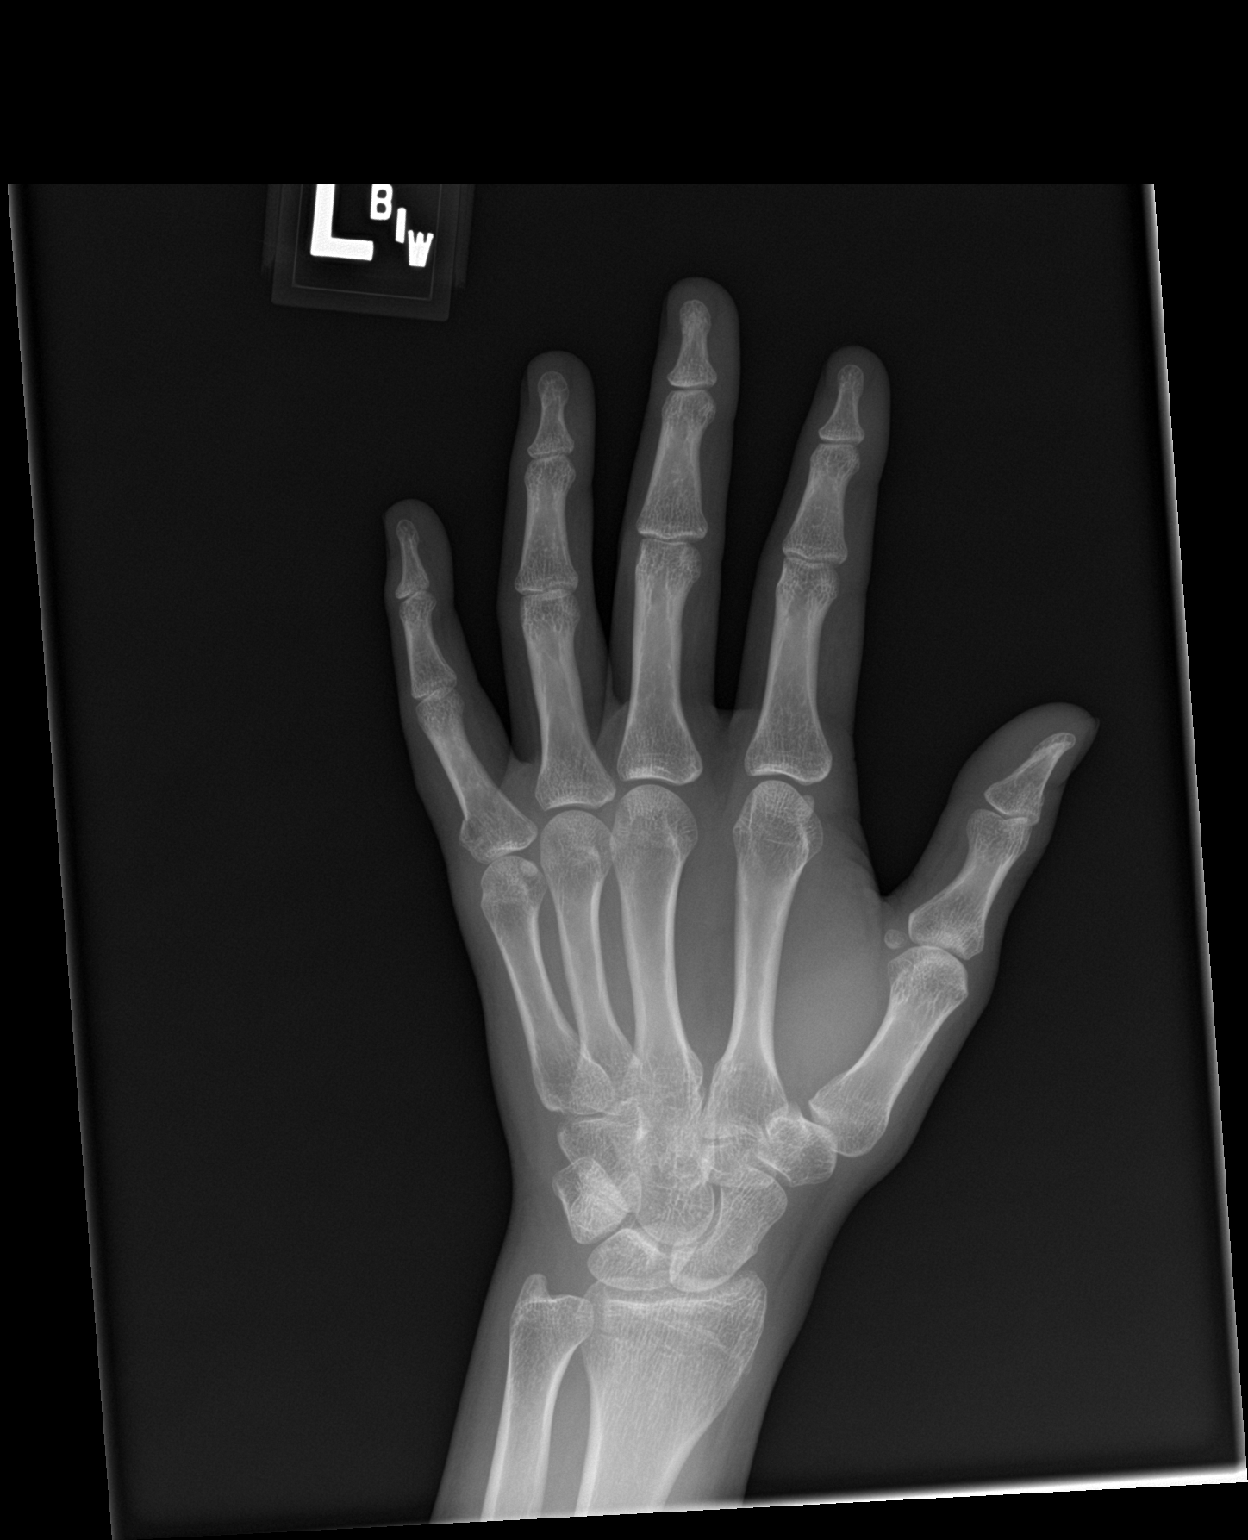

[hand lat]
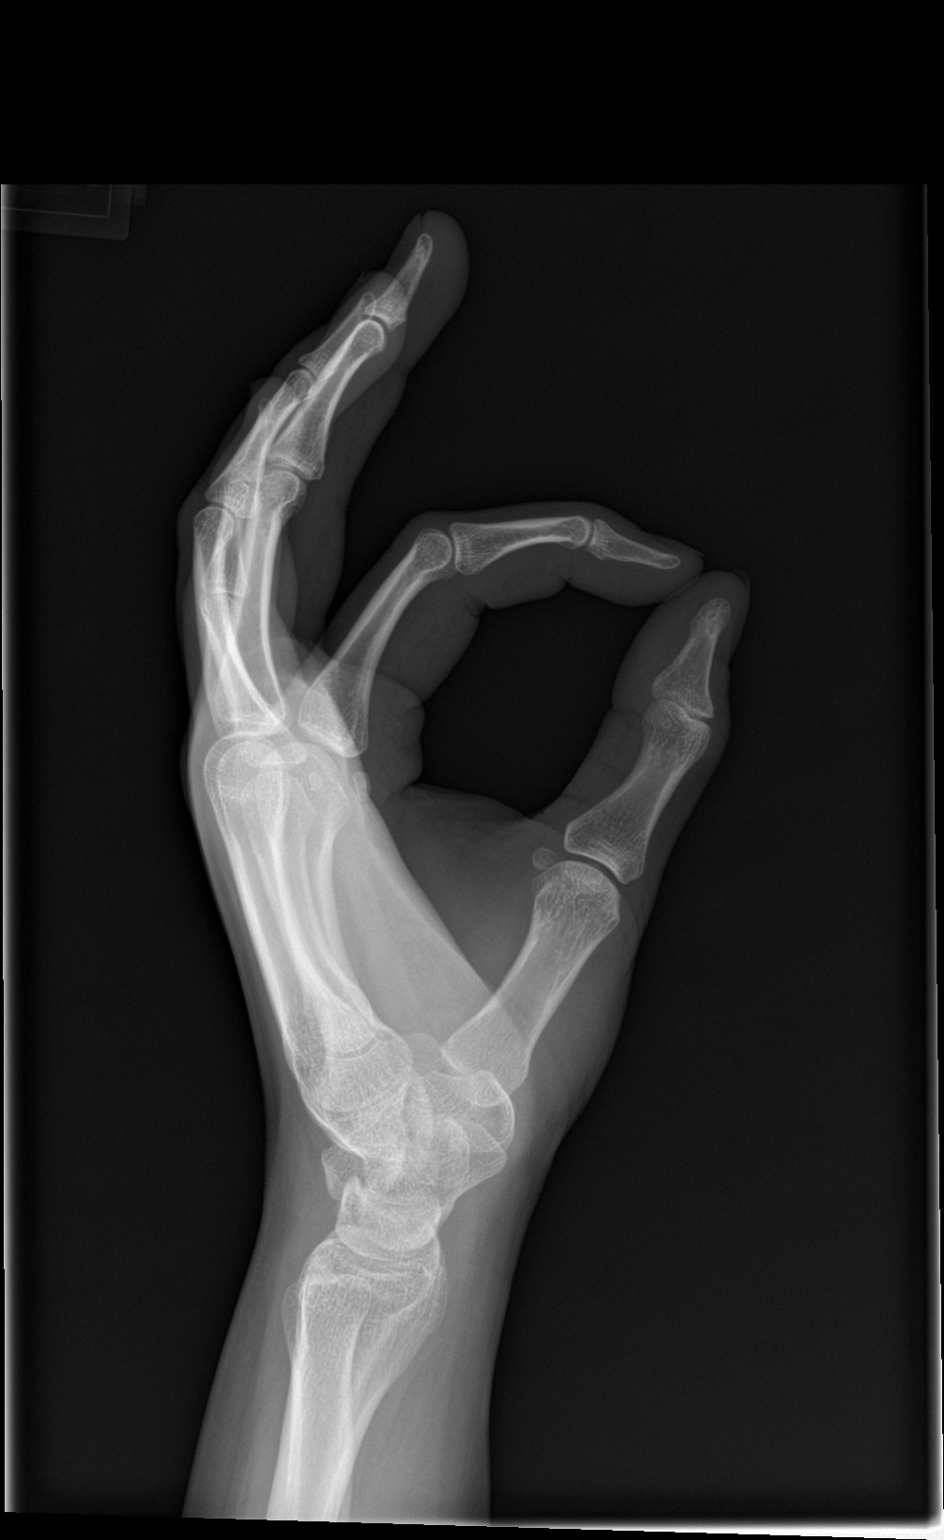

[3 of 3 positions shown; findings below may reference images not displayed]

FINDINGS: A nondisplaced fracture of the proximal aspect of the little finger
proximal phalanx is noted.

No subluxation or dislocation.

No other bony abnormalities are noted.
IMPRESSION: Nondisplaced fracture of the proximal aspect of the little finger
proximal phalanx.

## 2019-12-26 IMAGING — DX DG CHEST 2V
2 series · 2 of 2 positions shown · non-contrast
Comparison: 08/21/2016

CLINICAL DATA: Vomiting since yesterday. Chest pain with
palpitations.

EXAM:
CHEST - 2 VIEW

[chest pa]
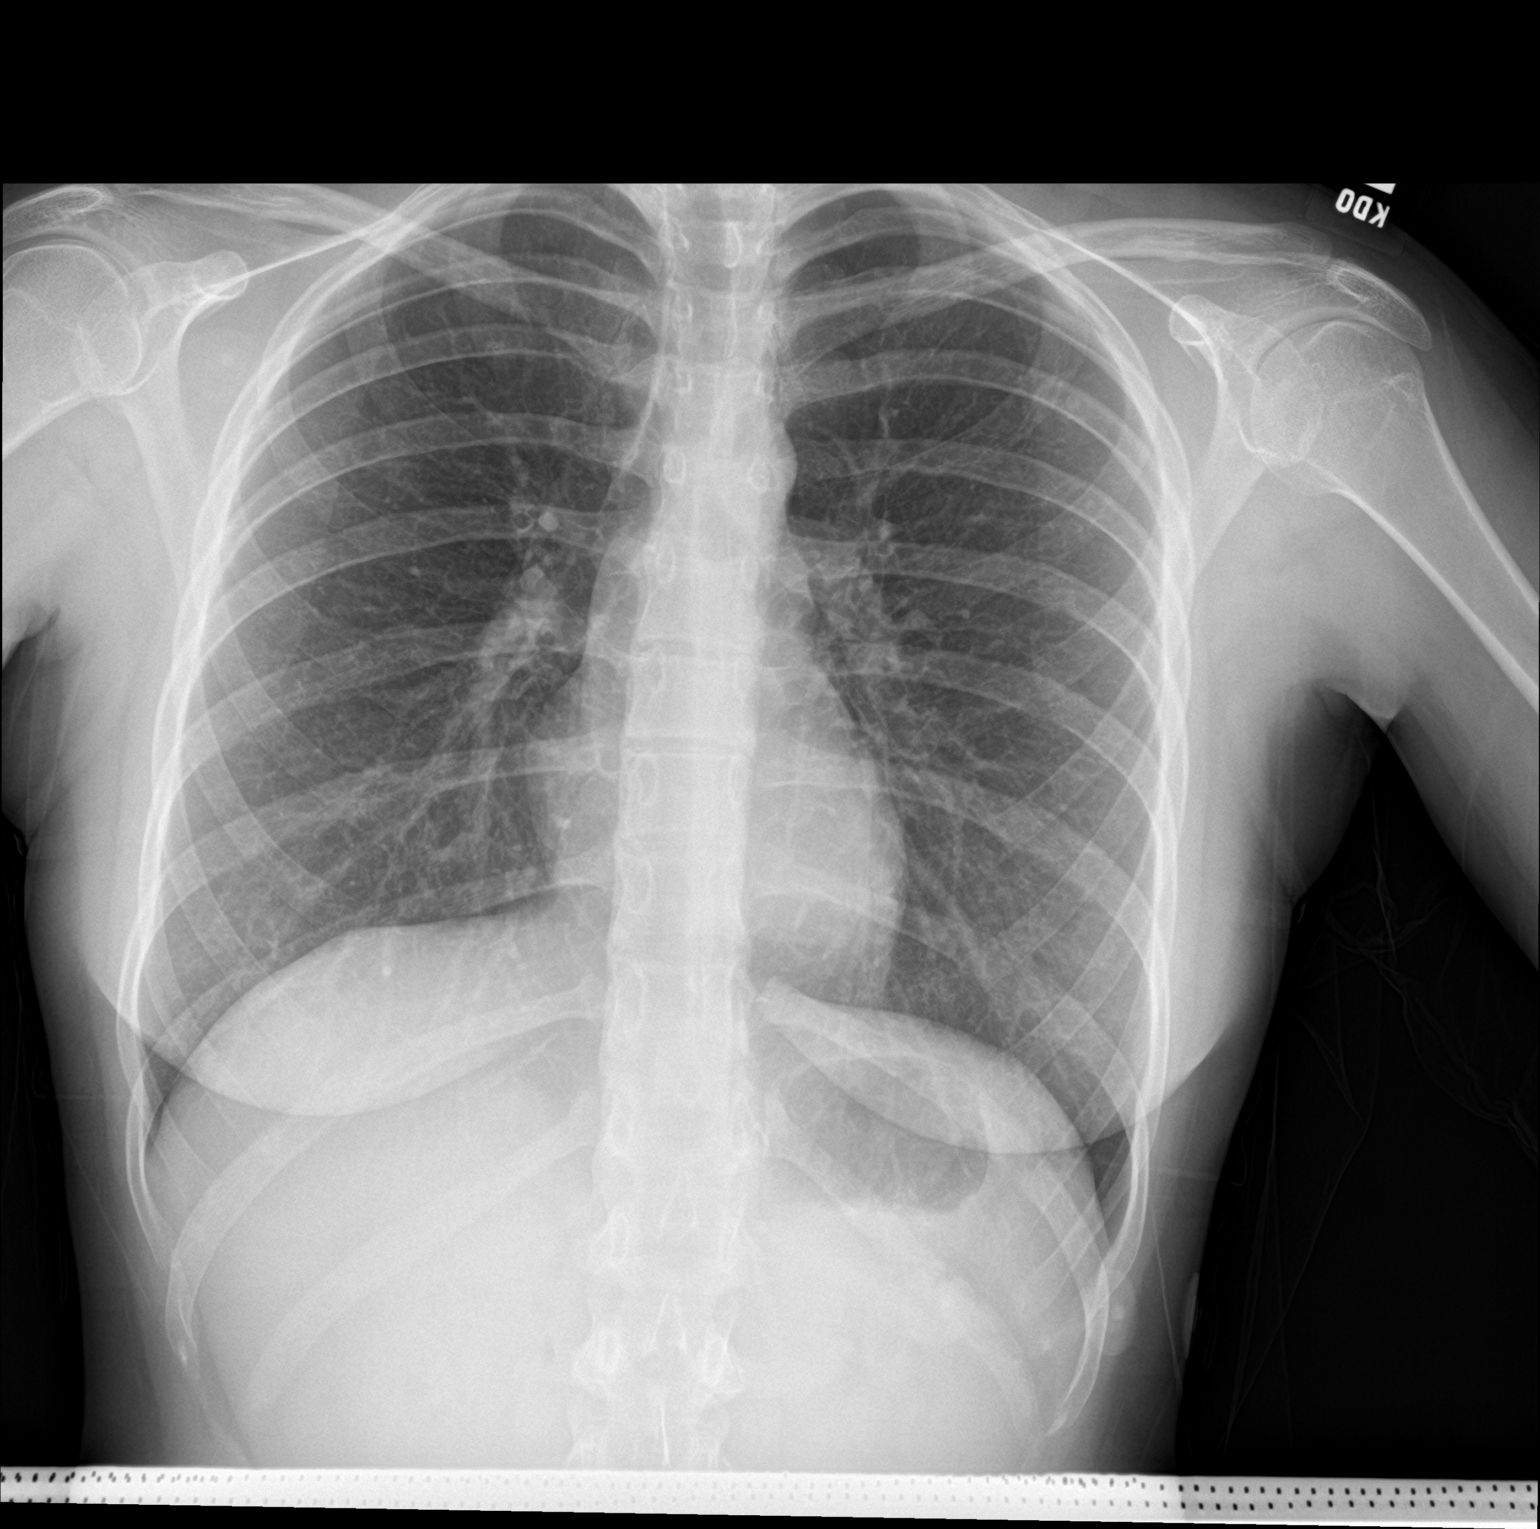

[chest lat]
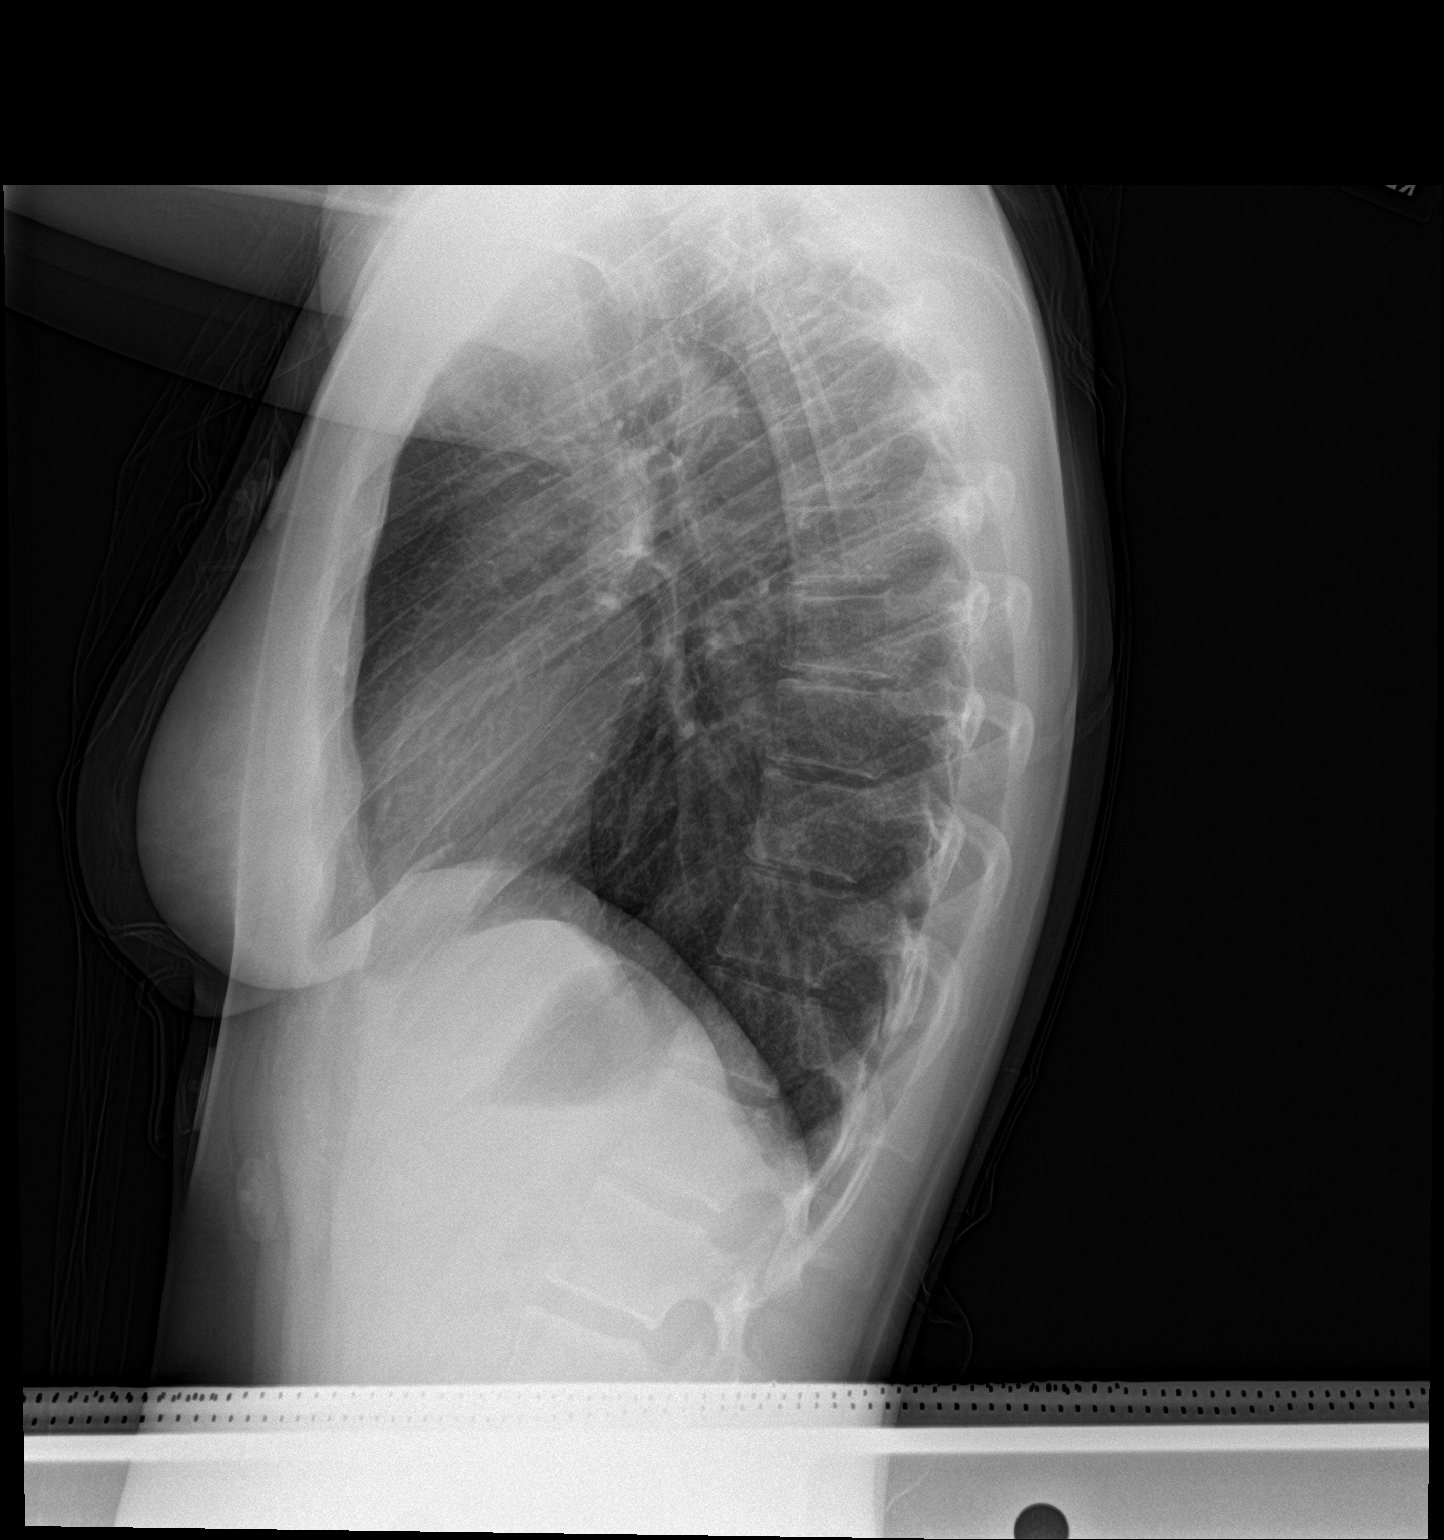

[2 of 2 positions shown; findings below may reference images not displayed]

FINDINGS: The heart size and mediastinal contours are within normal limits.
Both lungs are clear. The visualized skeletal structures are
unremarkable.
IMPRESSION: No active cardiopulmonary disease.

## 2021-07-07 ENCOUNTER — Emergency Department (HOSPITAL_COMMUNITY)
Admission: EM | Admit: 2021-07-07 | Discharge: 2021-07-07 | Discharge status: Against Medical Advice | Payer: Medicaid Other | Attending: Emergency Medicine | Admitting: Emergency Medicine

## 2021-07-07 ENCOUNTER — Encounter (HOSPITAL_COMMUNITY): Payer: Self-pay | Admitting: *Deleted

## 2021-07-07 ENCOUNTER — Other Ambulatory Visit: Payer: Self-pay

## 2021-07-07 DIAGNOSIS — Z5321 Procedure and treatment not carried out due to patient leaving prior to being seen by health care provider: Secondary | ICD-10-CM | POA: Diagnosis not present

## 2021-07-07 DIAGNOSIS — R739 Hyperglycemia, unspecified: Secondary | ICD-10-CM | POA: Diagnosis present

## 2021-07-07 LAB — URINALYSIS, ROUTINE W REFLEX MICROSCOPIC
Bilirubin Urine: NEGATIVE
Glucose, UA: 150 mg/dL — AB
Hgb urine dipstick: NEGATIVE
Ketones, ur: 20 mg/dL — AB
Leukocytes,Ua: NEGATIVE
Nitrite: NEGATIVE
Protein, ur: 30 mg/dL — AB
Specific Gravity, Urine: 1.02 (ref 1.005–1.030)
pH: 5 (ref 5.0–8.0)

## 2021-07-07 LAB — CBC WITH DIFFERENTIAL/PLATELET
Abs Immature Granulocytes: 0.02 10*3/uL (ref 0.00–0.07)
Basophils Absolute: 0.1 10*3/uL (ref 0.0–0.1)
Basophils Relative: 1 %
Eosinophils Absolute: 1.5 10*3/uL — ABNORMAL HIGH (ref 0.0–0.5)
Eosinophils Relative: 16 %
HCT: 43.2 % (ref 36.0–46.0)
Hemoglobin: 14.9 g/dL (ref 12.0–15.0)
Immature Granulocytes: 0 %
Lymphocytes Relative: 23 %
Lymphs Abs: 2.1 10*3/uL (ref 0.7–4.0)
MCH: 32.3 pg (ref 26.0–34.0)
MCHC: 34.5 g/dL (ref 30.0–36.0)
MCV: 93.5 fL (ref 80.0–100.0)
Monocytes Absolute: 0.5 10*3/uL (ref 0.1–1.0)
Monocytes Relative: 5 %
Neutro Abs: 5.2 10*3/uL (ref 1.7–7.7)
Neutrophils Relative %: 55 %
Platelets: 379 10*3/uL (ref 150–400)
RBC: 4.62 MIL/uL (ref 3.87–5.11)
RDW: 12.3 % (ref 11.5–15.5)
WBC: 9.4 10*3/uL (ref 4.0–10.5)
nRBC: 0 % (ref 0.0–0.2)

## 2021-07-07 LAB — CBG MONITORING, ED: Glucose-Capillary: 209 mg/dL — ABNORMAL HIGH (ref 70–99)

## 2021-07-07 LAB — COMPREHENSIVE METABOLIC PANEL
ALT: 15 U/L (ref 0–44)
AST: 20 U/L (ref 15–41)
Albumin: 4.2 g/dL (ref 3.5–5.0)
Alkaline Phosphatase: 91 U/L (ref 38–126)
Anion gap: 10 (ref 5–15)
BUN: 10 mg/dL (ref 6–20)
CO2: 27 mmol/L (ref 22–32)
Calcium: 9.5 mg/dL (ref 8.9–10.3)
Chloride: 97 mmol/L — ABNORMAL LOW (ref 98–111)
Creatinine, Ser: 0.65 mg/dL (ref 0.44–1.00)
GFR, Estimated: >60 mL/min (ref >60)
Glucose, Bld: 225 mg/dL — ABNORMAL HIGH (ref 70–99)
Potassium: 3.7 mmol/L (ref 3.5–5.1)
Sodium: 134 mmol/L — ABNORMAL LOW (ref 135–145)
Total Bilirubin: 1.6 mg/dL — ABNORMAL HIGH (ref 0.3–1.2)
Total Protein: 7.4 g/dL (ref 6.5–8.1)

## 2021-07-07 LAB — BETA-HYDROXYBUTYRIC ACID: Beta-Hydroxybutyric Acid: 0.9 mmol/L — ABNORMAL HIGH (ref 0.05–0.27)

## 2021-07-07 NOTE — ED Notes (Signed)
Pt is leaving AMA due to wait time

## 2021-07-07 NOTE — ED Triage Notes (Signed)
Pt reports having cbg >400 with excessive thirst, urination and fatigue.

## 2021-07-07 NOTE — ED Provider Notes (Signed)
Emergency Medicine Provider Triage Evaluation Note  Nancy Gallagher , a 20 y.o. female  was evaluated in triage.  Pt complains of hyperglycemia.  Reports that yesterday and today her glucometer read high.  Patient is type I diabetic has been taking all of her medications as prescribed.  Patient did take her insulin today.  Patient endorses excessive thirst, urination, fatigue, generalized abdominal pain.  Review of Systems  Positive: Hyperglycemia, excessive thirst, urination, fatigue, generalized abdominal pain Negative: Nausea, vomiting, diarrhea, shortness of breath, syncope  Physical Exam  BP 119/86 (BP Location: Left Arm)   Pulse 91   Temp 98 F (36.7 C) (Oral)   Resp 20   SpO2 100%  Gen:   Awake, no distress   Resp:  Normal effort  MSK:   Moves extremities without difficulty  Other:  Abdomen soft, nondistended, nontender, no peritoneal signs.  Medical Decision Making  Medically screening exam initiated at 10:42 AM.  Appropriate orders placed.  Nancy Gallagher was informed that the remainder of the evaluation will be completed by another provider, this initial triage assessment does not replace that evaluation, and the importance of remaining in the ED until their evaluation is complete.     Berneice Heinrich 07/07/21 1117    Benjiman Core, MD 07/07/21 [phone]

## 2025-01-28 ENCOUNTER — Ambulatory Visit: Admitting: "Endocrinology
